# Patient Record
Sex: Female | Born: 1970 | Marital: Married | Smoking: Never smoker
Health system: Southern US, Community
[De-identification: ages and names within clinical notes are randomized; demographics above are authoritative.]

## PROBLEM LIST (undated history)

## (undated) DIAGNOSIS — D649 Anemia, unspecified: Secondary | ICD-10-CM

---

## 2014-07-04 ENCOUNTER — Ambulatory Visit: Payer: Self-pay | Admitting: Internal Medicine

## 2014-07-04 LAB — RAPID STREP-A WITH REFLX: Micro Text Report: NEGATIVE

## 2014-07-04 LAB — RAPID INFLUENZA A&B ANTIGENS

## 2014-07-07 LAB — BETA STREP CULTURE(ARMC)

## 2014-09-23 ENCOUNTER — Ambulatory Visit: Payer: Self-pay

## 2014-09-23 ENCOUNTER — Other Ambulatory Visit: Payer: Self-pay | Admitting: Occupational Medicine

## 2014-09-23 DIAGNOSIS — R7612 Nonspecific reaction to cell mediated immunity measurement of gamma interferon antigen response without active tuberculosis: Secondary | ICD-10-CM

## 2015-09-22 ENCOUNTER — Ambulatory Visit
Admission: EM | Admit: 2015-09-22 | Discharge: 2015-09-22 | Disposition: A | Discharge status: Home / Self Care | Payer: BC Managed Care – PPO | Attending: Emergency Medicine | Admitting: Emergency Medicine

## 2015-09-22 ENCOUNTER — Encounter: Payer: Self-pay | Admitting: *Deleted

## 2015-09-22 DIAGNOSIS — J029 Acute pharyngitis, unspecified: Secondary | ICD-10-CM | POA: Diagnosis not present

## 2015-09-22 DIAGNOSIS — J069 Acute upper respiratory infection, unspecified: Secondary | ICD-10-CM | POA: Diagnosis not present

## 2015-09-22 HISTORY — DX: Anemia, unspecified: D64.9

## 2015-09-22 LAB — RAPID STREP SCREEN (MED CTR MEBANE ONLY): Streptococcus, Group A Screen (Direct): NEGATIVE

## 2015-09-22 MED ORDER — BENZONATATE 200 MG PO CAPS: ORAL_CAPSULE | ORAL | Status: AC

## 2015-09-22 MED ORDER — AMOXICILLIN-POT CLAVULANATE 875-125 MG PO TABS: 1.0000 | ORAL_TABLET | Freq: Two times a day (BID) | ORAL | Status: AC

## 2015-09-22 MED ORDER — HYDROCOD POLST-CPM POLST ER 10-8 MG/5ML PO SUER: 5.0000 mL | Freq: Two times a day (BID) | ORAL | Status: AC

## 2015-09-22 NOTE — ED Provider Notes (Signed)
CSN: [id number]     Arrival date & time 09/22/15  1510 History   First MD Initiated Contact with Patient 09/22/15 1649     Chief Complaint  Patient presents with  . Sore Throat  . 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 She is not febrile but has had some chills that have been shaking at times. O2 sat on room air is 98% temperature is 90.8 degrees pulse 70 respirations 18 blood pressure 125/78. She states that she was feeling better yesterday and went on a 5 mile hike but afterwards felt worse.     Past Medical History  Diagnosis Date  . Anemia    History reviewed. No pertinent past surgical history. History reviewed. No pertinent family history. Social History  Substance Use Topics  . Smoking status: Never Smoker   . Smokeless tobacco: Never Used  . Alcohol Use: No   OB History    No data available     Review of Systems  Constitutional: Positive for chills, activity change and fatigue. Negative for fever.  HENT: Positive for congestion, postnasal drip, rhinorrhea, sinus pressure and sore throat.   Respiratory: Positive for cough. Negative for shortness of breath, wheezing and stridor.   Neurological: Positive for headaches.  All other systems reviewed and are negative.   Allergies  Review of patient's allergies indicates no known allergies.  Home Medications   Prior to Admission medications   Medication Sig Start Date End Date Taking? Authorizing Provider  amoxicillin-clavulanate (AUGMENTIN) 875-125 MG tablet Take 1 tablet by mouth every 12 (twelve) hours. 09/22/15   Lutricia Feil, PA-C  benzonatate (TESSALON) 200 MG capsule Take one cap TID PRN cough 09/22/15   Lutricia Feil, PA-C  chlorpheniramine-HYDROcodone [facility] ER) 10-8 MG/5ML SUER Take 5 mLs by mouth 2 (two) times daily. 09/22/15   Lutricia Feil, PA-C   Meds Ordered and Administered this Visit  Medications - No data to display  BP 125/78 mmHg  Pulse 70  Temp(Src) 98 F (36.7 C) (Oral)  Resp 18  Ht  (1.575 m)  Wt 120 lb (54.432 kg)  BMI 21.94 kg/m2  SpO2 98%  LMP 09/01/2015 No data found.   Physical Exam  Constitutional: She is oriented to person, place, and time. She appears well-developed and well-nourished. No distress.  HENT:  Head: Normocephalic and atraumatic.  Right Ear: External ear normal.  Left Ear: External ear normal.  Nose: Nose normal.  Mouth/Throat: Oropharynx is clear and moist. No oropharyngeal exudate.  Eyes: Conjunctivae are normal. Pupils are equal, round, and reactive to light. Right eye exhibits no discharge. Left eye exhibits no discharge.  Neck: Normal range of motion. Neck supple.  Pulmonary/Chest: Effort normal and breath sounds normal. No respiratory distress. She has no wheezes. She has no rales.  Musculoskeletal: Normal range of motion. She exhibits no edema or tenderness.  Lymphadenopathy:    She has no cervical adenopathy.  Neurological: She is alert and oriented to person, place, and time.  Skin: Skin is warm. She is not diaphoretic.  Psychiatric: She has a normal mood and affect. Her behavior is normal. Judgment and thought content normal.  Nursing note and vitals reviewed.   ED Course  Procedures (including critical care time)  Labs Review Labs Reviewed  RAPID STREP SCREEN (NOT AT [facility])  CULTURE, GROUP A STREP ([facility])  CBC WITH DIFFERENTIAL/PLATELET  MONONUCLEOSIS SCREEN    Imaging Review No results found.   Visual Acuity Review  Right Eye Distance:   Left Eye Distance:   Bilateral Distance:    Right Eye Near:   Left Eye Near:    Bilateral Near:         MDM   1. Pharyngitis   2. Acute URI    Discharge Medication List as of 09/22/2015  5:52 PM     START taking these medications   Details  amoxicillin-clavulanate (AUGMENTIN) 875-125 MG tablet Take 1 tablet by mouth every 12 (twelve) hours., Starting 09/22/2015, Until Discontinued, Normal    benzonatate (TESSALON) 200 MG capsule Take one cap TID PRN cough, Normal    chlorpheniramine-HYDROcodone (TUSSIONEX PENNKINETIC ER) 10-8 MG/5ML SUER Take 5 mLs by mouth 2 (two) times daily., Starting 09/22/2015, Until Discontinued, Print      Plan: 1. Test/x-ray results and diagnosis reviewed with patient 2. rx as per orders; risks, benefits, potential side effects reviewed with patient 3. Recommend supportive treatment with Rest and fluids. 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 It is highly very likely that it may be positive for subgroups. I've also given her medications to help with her cough so she is able to rest more comfortably. She denied improving or is there is any worsening she should follow-up with her primary care physician 4. F/u prn if symptoms worsen or don't improve     Lutricia FeilWilliam P Xian Alves, PA-C 09/22/15 1757

## 2015-09-22 NOTE — ED Notes (Signed)
Patient started having sore throat symptoms 4 days ago and symptoms of severe cough and chest congestion started last PM.

## 2015-09-22 NOTE — Discharge Instructions (Signed)
Cool Mist Vaporizers Vaporizers may help relieve the symptoms of a cough and cold. They add moisture to the air, which helps mucus to become thinner and less sticky. This makes it easier to breathe and cough up secretions. Cool mist vaporizers do not cause serious burns like hot mist vaporizers, which may also be called steamers or humidifiers. Vaporizers have not been proven to help with colds. You should not use a vaporizer if you are allergic to mold. HOME CARE INSTRUCTIONS  Follow the package instructions for the vaporizer.  Do not use anything other than distilled water in the vaporizer.  Do not run the vaporizer all of the time. This can cause mold or bacteria to grow in the vaporizer.  Clean the vaporizer after each time it is used.  Clean and dry the vaporizer well before storing it.  Stop using the vaporizer if worsening respiratory symptoms develop.   This information is not intended to replace advice given to you by your health care provider. Make sure you discuss any questions you have with your health care provider.   Document Released: 03/18/2004 Document Revised: 06/26/2013 Document Reviewed: 11/08/2012 Elsevier Interactive Patient Education 2016 Else[employer]  Pharyngitis Pharyngitis is redness, pain, and swelling (inflammation) of your pharynx.  CAUSES  Pharyngitis is usually caused by infection. Most of the time, these infections are from viruses (viral) and are part of a cold. However, sometimes pharyngitis is caused by bacteria (bacterial). Pharyngitis can also be caused by allergies. Viral pharyngitis may be spread from person to person by coughing, sneezing, and personal items or utensils (cups, forks, spoons, toothbrushes). Bacterial pharyngitis may be spread from person to person by more intimate contact, such as kissing.  SIGNS AND SYMPTOMS  Symptoms of pharyngitis include:   Sore throat.   Tiredness (fatigue).   Low-grade fever.   Headache.  Joint  pain and muscle aches.  Skin rashes.  Swollen lymph nodes.  Plaque-like film on throat or tonsils (often seen with bacterial pharyngitis). DIAGNOSIS  Your health care provider will ask you questions about your illness and your symptoms. Your medical history, along with a physical exam, is often all that is needed to diagnose pharyngitis. Sometimes, a rapid strep test is done. Other lab tests may also be done, depending on the suspected cause.  TREATMENT  Viral pharyngitis will usually get better in 3-4 days without the use of medicine. Bacterial pharyngitis is treated with medicines that kill germs (antibiotics).  HOME CARE INSTRUCTIONS   Drink enough water and fluids to keep your urine clear or pale yellow.   Only take over-the-counter or prescription medicines as directed by your health care provider:   If you are prescribed antibiotics, make sure you finish them even if you start to feel better.   Do not take aspirin.   Get lots of rest.   Gargle with 8 oz of salt water ( tsp of salt per 1 qt of water) as often as every 1-2 hours to soothe your throat.   Throat lozenges (if you are not at risk for choking) or sprays may be used to soothe your throat. SEEK MEDICAL CARE IF:   You have large, tender lumps in your neck.  You have a rash.  You cough up green, yellow-brown, or bloody spit. SEEK IMMEDIATE MEDICAL CARE IF:   Your neck becomes stiff.  You drool or are unable to swallow liquids.  You vomit or are unable to keep medicines or liquids down.  You have severe pain  that does not go away with the use of recommended medicines.  You have trouble breathing (not caused by a stuffy nose). MAKE SURE YOU:   Understand these instructions.  Will watch your condition.  Will get help right away if you are not doing well or get worse.   This information is not intended to replace advice given to you by your health care provider. Make sure you discuss any questions  you have with your health care provider.   Document Released: 06/21/2005 Document Revised: 04/11/2013 Document Reviewed: 02/26/2013 Elsevier Interactive Patient Education [employer]2016 [employer] Inc.

## 2015-09-26 LAB — CULTURE, GROUP A STREP (THRC)

## 2015-10-16 IMAGING — CR DG CHEST 1V
1 series · 1 of 1 positions shown · non-contrast
Comparison: None.

CLINICAL DATA: Positive TB test.

EXAM:
CHEST  1 VIEW

[view not recorded]
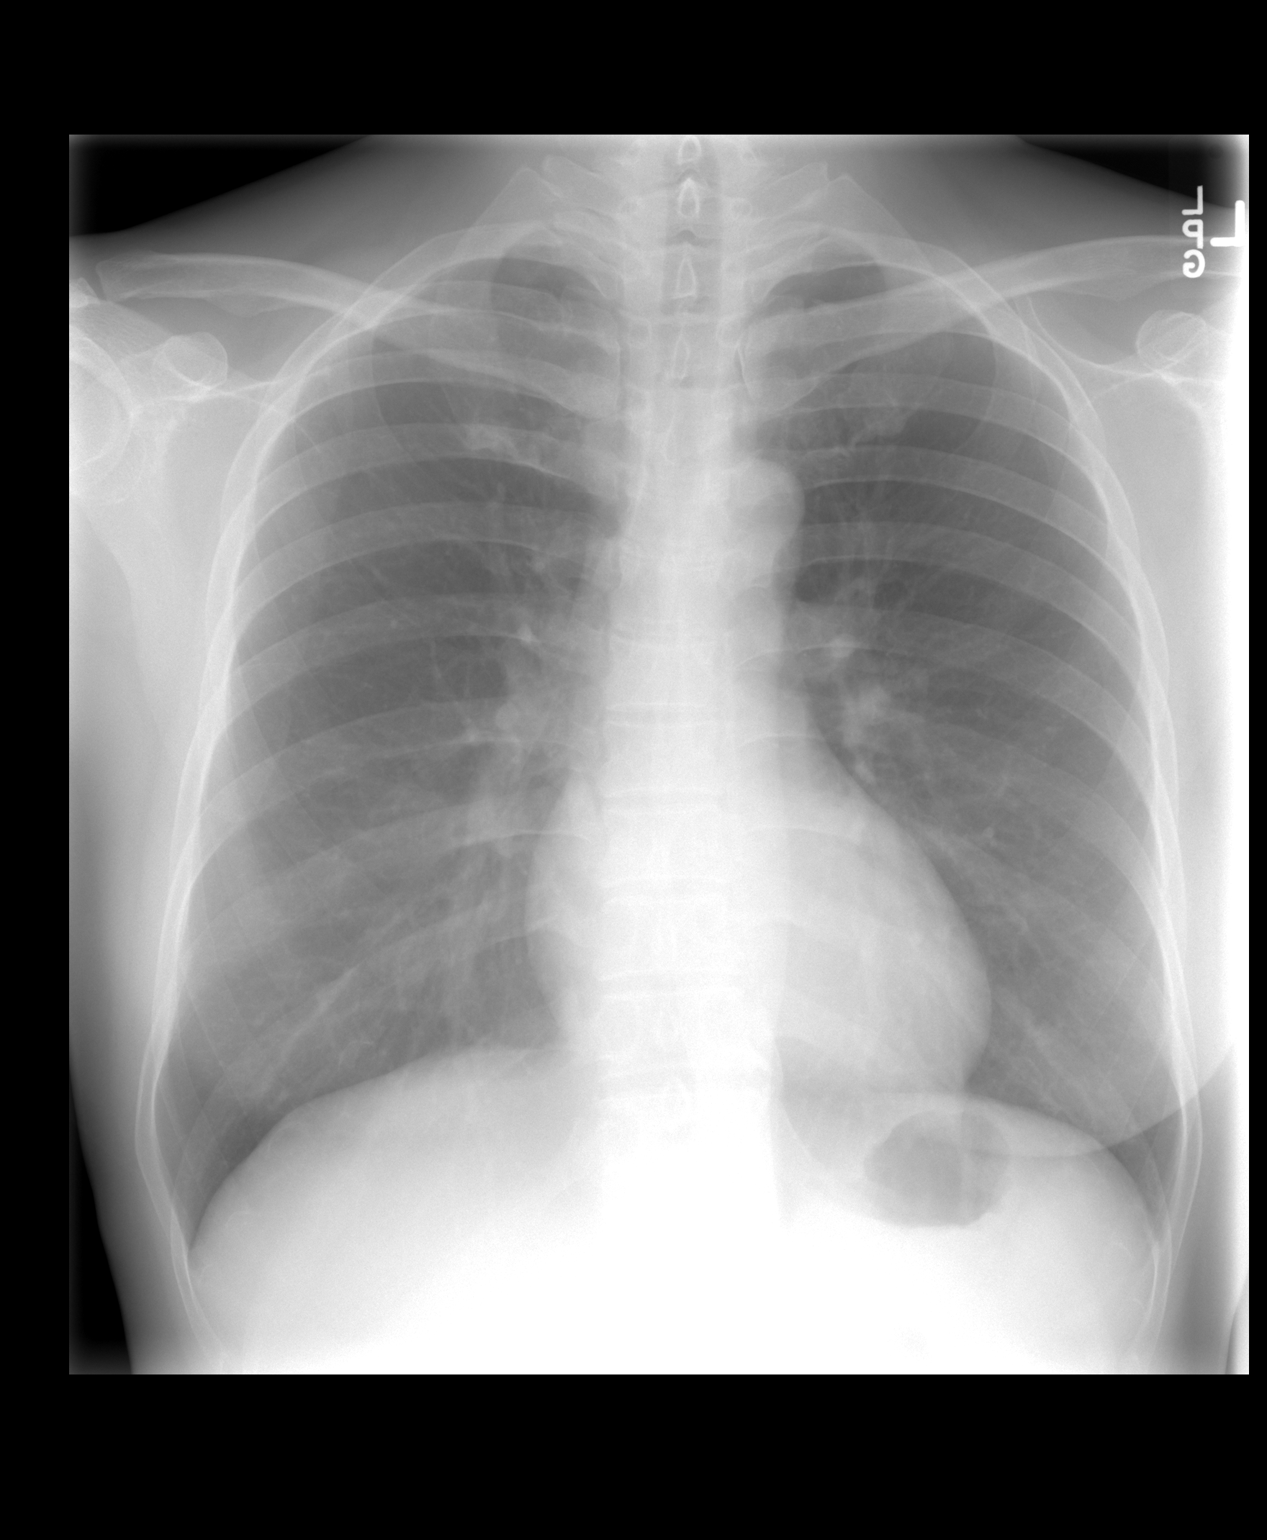

[1 of 1 positions shown; findings below may reference images not displayed]

FINDINGS: The heart size and mediastinal contours are within normal limits.
Both lungs are clear. The visualized skeletal structures are
unremarkable.
IMPRESSION: Normal chest x-ray
